# Patient Record
Sex: Female | Born: 1975
Health system: Southern US, Community
[De-identification: ages and names within clinical notes are randomized; demographics above are authoritative.]

## PROBLEM LIST (undated history)

## (undated) HISTORY — PX: TUBAL LIGATION: SHX77

---

## 2001-06-23 ENCOUNTER — Ambulatory Visit (HOSPITAL_COMMUNITY): Admission: RE | Admit: 2001-06-23 | Discharge: 2001-06-23 | Payer: Self-pay | Admitting: Internal Medicine

## 2011-05-30 ENCOUNTER — Encounter: Payer: Self-pay | Admitting: *Deleted

## 2011-05-30 ENCOUNTER — Emergency Department (HOSPITAL_COMMUNITY)
Admission: EM | Admit: 2011-05-30 | Discharge: 2011-05-30 | Disposition: A | Discharge status: Home / Self Care | Payer: Self-pay | Attending: Emergency Medicine | Admitting: Emergency Medicine

## 2011-05-30 ENCOUNTER — Emergency Department (HOSPITAL_COMMUNITY): Payer: Self-pay

## 2011-05-30 DIAGNOSIS — M25519 Pain in unspecified shoulder: Secondary | ICD-10-CM | POA: Insufficient documentation

## 2011-05-30 DIAGNOSIS — I10 Essential (primary) hypertension: Secondary | ICD-10-CM

## 2011-05-30 DIAGNOSIS — M7918 Myalgia, other site: Secondary | ICD-10-CM

## 2011-05-30 DIAGNOSIS — M542 Cervicalgia: Secondary | ICD-10-CM | POA: Insufficient documentation

## 2011-05-30 MED ORDER — KETOROLAC TROMETHAMINE 60 MG/2ML IM SOLN
60.0000 mg | Freq: Once | INTRAMUSCULAR | Status: AC
  Administered 2011-05-30: 60 mg via INTRAMUSCULAR
  Filled 2011-05-30: qty 2

## 2011-05-30 MED ORDER — DIAZEPAM 5 MG PO TABS
5.0000 mg | ORAL_TABLET | Freq: Once | ORAL | Status: AC
  Administered 2011-05-30: 5 mg via ORAL
  Filled 2011-05-30: qty 1

## 2011-05-30 MED ORDER — DIAZEPAM 5 MG PO TABS: 5.0000 mg | ORAL_TABLET | Freq: Three times a day (TID) | ORAL | Status: DC | PRN

## 2011-05-30 MED ORDER — DIAZEPAM 5 MG PO TABS: 5.0000 mg | ORAL_TABLET | Freq: Three times a day (TID) | ORAL | Status: AC | PRN

## 2011-05-30 NOTE — ED Provider Notes (Signed)
History     CSN: [id number] Arrival date & time: 05/30/2011  9:09 AM   Chief Complaint  Patient presents with  . Neck Pain     (Include location/radiation/quality/duration/timing/severity/associated sxs/prior treatment) Patient is a 35 y.o. female presenting with neck pain. The history is provided by the patient.  Neck Pain  Episode onset: She reports pain upon waking in her left neck and shoulder area, 3 weeks ago ,  which has not improved. The problem occurs constantly. The problem has not changed since onset.The pain is associated with nothing (Denies any known injury.). There has been no fever. The pain is present in the left side. The quality of the pain is described as stabbing. The pain radiates to the left shoulder. The pain is at a severity of 9/10. The pain is severe. The symptoms are aggravated by position and bending (palpation). The pain is the same all the time. Pertinent negatives include no chest pain, no numbness, no headaches, no paresis, no tingling and no weakness. She has tried NSAIDs for the symptoms. The treatment provided no relief.     History reviewed. No pertinent past medical history.   Past Surgical History  Procedure Date  . Cesarean section     History reviewed. No pertinent family history.  History  Substance Use Topics  . Smoking status: Current Everyday Smoker -- 0.5 packs/day  . Smokeless tobacco: Not on file  . Alcohol Use: No    OB History    Grav Para Term Preterm Abortions TAB SAB Ect Mult Living                  Review of Systems  Constitutional: Negative for fever.  HENT: Positive for neck pain. Negative for ear pain, congestion, sore throat and rhinorrhea.   Eyes: Negative.   Respiratory: Negative for cough, chest tightness and shortness of breath.   Cardiovascular: Negative for chest pain.  Gastrointestinal: Negative for nausea and abdominal pain.  Genitourinary: Negative.   Musculoskeletal: Negative for back pain, joint  swelling and arthralgias.  Skin: Negative.  Negative for rash and wound.  Neurological: Negative for dizziness, tingling, weakness, light-headedness, numbness and headaches.  Hematological: Negative.   Psychiatric/Behavioral: Negative.     Allergies  Penicillins and Sulfa antibiotics  Home Medications   Current Outpatient Rx  Name Route Sig Dispense Refill  . IBUPROFEN 200 MG PO TABS Oral Take 600 mg by mouth every 6 (six) hours as needed. For pain       Physical Exam    BP 152/106  Pulse 91  Temp(Src) 98.7 F (37.1 C) (Oral)  Resp 18  Ht 5\' 4"  (1.626 m)  Wt 180 lb (81.647 kg)  BMI 30.90 kg/m2  SpO2 100%  LMP 05/26/2011  Physical Exam  Nursing note and vitals reviewed. Constitutional: She is oriented to person, place, and time. She appears well-developed and well-nourished.  HENT:  Head: Normocephalic and atraumatic.  Eyes: Conjunctivae are normal.  Neck: Normal range of motion.  Cardiovascular: Normal rate, regular rhythm, normal heart sounds and intact distal pulses.   Pulmonary/Chest: Effort normal and breath sounds normal. She has no wheezes.  Abdominal: Soft. Bowel sounds are normal. There is no tenderness.  Musculoskeletal: She exhibits tenderness. She exhibits no edema.       Cervical back: She exhibits decreased range of motion, tenderness and bony tenderness. She exhibits no swelling, no edema and no spasm.  Neurological: She is alert and oriented to person, place, and time.  Skin: Skin  is warm and dry.  Psychiatric: She has a normal mood and affect.    ED Course  Procedures     MDM  Dg Cervical Spine Complete  05/30/2011  *RADIOLOGY REPORT*  Clinical Data: Neck pain.  CERVICAL SPINE - 4+ VIEWS  Comparison:  None.  Findings:  There is no evidence of cervical spine fracture or prevertebral soft tissue swelling.  Alignment is normal.  No other significant bone abnormalities are identified.  IMPRESSION: Negative cervical spine radiographs.  Original  Report Authenticated By: Reola Calkins, M.D.    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  DDX:  Muscle strain/myofascial pain  vs cervical strain.         Candis Musa, PA 05/30/11 1013

## 2011-05-30 NOTE — ED Notes (Signed)
Pt states she has pain in neck that she describes as a crick in her neck. Pt denies injury to neck.

## 2011-06-06 NOTE — ED Provider Notes (Signed)
Medical screening examination/treatment/procedure(s) were performed by non-physician practitioner and as supervising physician I was immediately available for consultation/collaboration.  Matvey Llanas, MD 06/06/11 0432 

## 2012-03-23 ENCOUNTER — Emergency Department (HOSPITAL_COMMUNITY)
Admission: EM | Admit: 2012-03-23 | Discharge: 2012-03-23 | Disposition: A | Discharge status: Home / Self Care | Payer: Self-pay | Attending: Emergency Medicine | Admitting: Emergency Medicine

## 2012-03-23 ENCOUNTER — Encounter (HOSPITAL_COMMUNITY): Payer: Self-pay | Admitting: *Deleted

## 2012-03-23 DIAGNOSIS — L723 Sebaceous cyst: Secondary | ICD-10-CM | POA: Insufficient documentation

## 2012-03-23 DIAGNOSIS — F17200 Nicotine dependence, unspecified, uncomplicated: Secondary | ICD-10-CM | POA: Insufficient documentation

## 2012-03-23 DIAGNOSIS — M25569 Pain in unspecified knee: Secondary | ICD-10-CM

## 2012-03-23 MED ORDER — ACETAMINOPHEN-CODEINE #3 300-30 MG PO TABS: 1.0000 | ORAL_TABLET | Freq: Four times a day (QID) | ORAL | Status: AC | PRN

## 2012-03-23 MED ORDER — MELOXICAM 7.5 MG PO TABS: ORAL_TABLET | ORAL | Status: DC

## 2012-03-23 NOTE — ED Notes (Signed)
Pt c/o pain in her right knee for approximately 6 months. Also c/o having a "knot behind her right knee that has been getting bigger over the last 4-5 months. States that it throbs at night and hurts all day in her knee cap. Pt able to move all extremities. Pt has swelling behind her right knee. No redness noted. Pt alert and oriented x 3. Skin warm and dry. Color pink.

## 2012-03-23 NOTE — ED Provider Notes (Signed)
History     CSN: [id number]  Arrival date & time 03/23/12  1523   First MD Initiated Contact with Patient 03/23/12 1554      Chief Complaint  Patient presents with  . Knee Pain    (Consider location/radiation/quality/duration/timing/severity/associated sxs/prior treatment) Patient is a 36 y.o. female presenting with knee pain. The history is provided by the patient.  Knee Pain This is a chronic problem. The current episode started more than 1 month ago. The problem has been gradually worsening. Associated symptoms include arthralgias. Pertinent negatives include no abdominal pain, chest pain, coughing or neck pain. The symptoms are aggravated by walking. She has tried NSAIDs for the symptoms. The treatment provided no relief.    History reviewed. No pertinent past medical history.  Past Surgical History  Procedure Date  . Cesarean section   . Tubal ligation     History reviewed. No pertinent family history.  History  Substance Use Topics  . Smoking status: Current Everyday Smoker -- 0.5 packs/day  . Smokeless tobacco: Not on file  . Alcohol Use: No    OB History    Grav Para Term Preterm Abortions TAB SAB Ect Mult Living                  Review of Systems  Constitutional: Negative for activity change.       All ROS Neg except as noted in HPI  HENT: Negative for nosebleeds and neck pain.   Eyes: Negative for photophobia and discharge.  Respiratory: Negative for cough, shortness of breath and wheezing.   Cardiovascular: Negative for chest pain and palpitations.  Gastrointestinal: Negative for abdominal pain and blood in stool.  Genitourinary: Negative for dysuria, frequency and hematuria.  Musculoskeletal: Positive for arthralgias. Negative for back pain.  Skin: Negative.   Neurological: Negative for dizziness, seizures and speech difficulty.  Psychiatric/Behavioral: Negative for hallucinations and confusion.    Allergies  Penicillins and Sulfa  antibiotics  Home Medications   Current Outpatient Rx  Name Route Sig Dispense Refill  . IBUPROFEN 200 MG PO TABS Oral Take 600 mg by mouth every 6 (six) hours as needed. For pain       BP 126/84  Pulse 78  Temp 98.2 F (36.8 C) (Oral)  Resp 18  Ht 5\' 4"  (1.626 m)  Wt 180 lb (81.647 kg)  BMI 30.90 kg/m2  SpO2 100%  LMP 03/09/2012  Physical Exam  Nursing note and vitals reviewed. Constitutional: She is oriented to person, place, and time. She appears well-developed and well-nourished.  Non-toxic appearance.  HENT:  Head: Normocephalic.  Right Ear: Tympanic membrane and external ear normal.  Left Ear: Tympanic membrane and external ear normal.  Eyes: EOM and lids are normal. Pupils are equal, round, and reactive to light.  Neck: Normal range of motion. Neck supple. Carotid bruit is not present.  Cardiovascular: Normal rate, regular rhythm, normal heart sounds, intact distal pulses and normal pulses.   Pulmonary/Chest: Breath sounds normal. No respiratory distress.  Abdominal: Soft. Bowel sounds are normal. There is no tenderness. There is no guarding.  Musculoskeletal: Normal range of motion.       Small cyst noted of the posterior right knee. There is crepitus with range of motion of the knee. There is no effusion. There is no hot joint. The distal pulses are symmetrical. Sensory is symmetrical. There is full range of motion of the right hip.  Lymphadenopathy:       Head (right side): No submandibular adenopathy  present.       Head (left side): No submandibular adenopathy present.    She has no cervical adenopathy.  Neurological: She is alert and oriented to person, place, and time. She has normal strength. No cranial nerve deficit or sensory deficit.  Skin: Skin is warm and dry.  Psychiatric: She has a normal mood and affect. Her speech is normal.    ED Course  Procedures (including critical care time)  Labs Reviewed - No data to display No results found.   No  diagnosis found.    MDM  I have reviewed nursing notes, vital signs, and all appropriate lab and imaging results for this patient. Patient reports 6 months of pain in the right knee. Recently she has noted some swelling of the cyst in the right knee. No acute or surgical findings at this time. Prescription for Mobic 7.5 mg twice daily given. Tylenol with codeine one every 4 hours as needed for pain also given. Patient is referred to orthopedics.       Kathie Dike, [place] 03/23/12 [phone]

## 2012-03-23 NOTE — ED Notes (Signed)
Swelling rt popliteal area for 6 mos, increasing pain

## 2012-03-25 NOTE — ED Provider Notes (Signed)
Medical screening examination/treatment/procedure(s) were performed by non-physician practitioner and as supervising physician I was immediately available for consultation/collaboration.   Laray Anger, DO 03/25/12 1525

## 2013-04-04 ENCOUNTER — Emergency Department (HOSPITAL_COMMUNITY)
Admission: EM | Admit: 2013-04-04 | Discharge: 2013-04-04 | Disposition: A | Discharge status: Home / Self Care | Payer: Self-pay | Attending: Emergency Medicine | Admitting: Emergency Medicine

## 2013-04-04 ENCOUNTER — Encounter (HOSPITAL_COMMUNITY): Payer: Self-pay | Admitting: Emergency Medicine

## 2013-04-04 DIAGNOSIS — R109 Unspecified abdominal pain: Secondary | ICD-10-CM | POA: Insufficient documentation

## 2013-04-04 DIAGNOSIS — Z3202 Encounter for pregnancy test, result negative: Secondary | ICD-10-CM | POA: Insufficient documentation

## 2013-04-04 DIAGNOSIS — R1013 Epigastric pain: Secondary | ICD-10-CM

## 2013-04-04 DIAGNOSIS — R110 Nausea: Secondary | ICD-10-CM | POA: Insufficient documentation

## 2013-04-04 DIAGNOSIS — F17200 Nicotine dependence, unspecified, uncomplicated: Secondary | ICD-10-CM | POA: Insufficient documentation

## 2013-04-04 DIAGNOSIS — Z880 Allergy status to penicillin: Secondary | ICD-10-CM | POA: Insufficient documentation

## 2013-04-04 LAB — LIPASE, BLOOD: Lipase: 25 U/L (ref 11–59)

## 2013-04-04 LAB — URINALYSIS, ROUTINE W REFLEX MICROSCOPIC
Bilirubin Urine: NEGATIVE
Glucose, UA: NEGATIVE mg/dL
Ketones, ur: NEGATIVE mg/dL
Leukocytes, UA: NEGATIVE
Protein, ur: NEGATIVE mg/dL
pH: 7 (ref 5.0–8.0)

## 2013-04-04 LAB — CBC WITH DIFFERENTIAL/PLATELET
Basophils Relative: 0 % (ref 0–1)
Eosinophils Absolute: 0.2 10*3/uL (ref 0.0–0.7)
Eosinophils Relative: 3 % (ref 0–5)
HCT: 44.2 % (ref 36.0–46.0)
Hemoglobin: 15.1 g/dL — ABNORMAL HIGH (ref 12.0–15.0)
Lymphs Abs: 2.5 10*3/uL (ref 0.7–4.0)
MCH: 34.1 pg — ABNORMAL HIGH (ref 26.0–34.0)
MCHC: 34.2 g/dL (ref 30.0–36.0)
MCV: 99.8 fL (ref 78.0–100.0)
Monocytes Absolute: 0.5 10*3/uL (ref 0.1–1.0)
Monocytes Relative: 5 % (ref 3–12)
Neutrophils Relative %: 65 % (ref 43–77)
RBC: 4.43 MIL/uL (ref 3.87–5.11)

## 2013-04-04 LAB — COMPREHENSIVE METABOLIC PANEL
Albumin: 3.9 g/dL (ref 3.5–5.2)
Alkaline Phosphatase: 58 U/L (ref 39–117)
BUN: 12 mg/dL (ref 6–23)
Creatinine, Ser: 0.71 mg/dL (ref 0.50–1.10)
GFR calc Af Amer: >90 mL/min (ref >90)
Glucose, Bld: 77 mg/dL (ref 70–99)
Potassium: 3.6 mEq/L (ref 3.5–5.1)
Total Protein: 7.5 g/dL (ref 6.0–8.3)

## 2013-04-04 MED ORDER — ONDANSETRON HCL 4 MG/2ML IJ SOLN
4.0000 mg | Freq: Once | INTRAMUSCULAR | Status: AC
  Administered 2013-04-04: 4 mg via INTRAVENOUS
  Filled 2013-04-04: qty 2

## 2013-04-04 MED ORDER — ONDANSETRON 4 MG PO TBDP: ORAL_TABLET | ORAL | Status: DC

## 2013-04-04 MED ORDER — KETOROLAC TROMETHAMINE 30 MG/ML IJ SOLN
30.0000 mg | Freq: Once | INTRAMUSCULAR | Status: AC
  Administered 2013-04-04: 30 mg via INTRAVENOUS
  Filled 2013-04-04: qty 1

## 2013-04-04 MED ORDER — OMEPRAZOLE 20 MG PO CPDR: 20.0000 mg | DELAYED_RELEASE_CAPSULE | Freq: Every day | ORAL | Status: DC

## 2013-04-04 MED ORDER — FAMOTIDINE IN NACL 20-0.9 MG/50ML-% IV SOLN
20.0000 mg | Freq: Once | INTRAVENOUS | Status: AC
  Administered 2013-04-04: 20 mg via INTRAVENOUS
  Filled 2013-04-04: qty 50

## 2013-04-04 MED ORDER — TRAMADOL HCL 50 MG PO TABS: 50.0000 mg | ORAL_TABLET | Freq: Four times a day (QID) | ORAL | Status: DC | PRN

## 2013-04-04 MED ORDER — SODIUM CHLORIDE 0.9 % IV BOLUS (SEPSIS)
1000.0000 mL | Freq: Once | INTRAVENOUS | Status: AC
  Administered 2013-04-04: 1000 mL via INTRAVENOUS

## 2013-04-04 NOTE — ED Provider Notes (Addendum)
CSN: [id number]     Arrival date & time 04/04/13  1607 History     First MD Initiated Contact with Patient 04/04/13 1613     Chief Complaint  Patient presents with  . Abdominal Pain  . Flank Pain  . Nausea   (Consider location/radiation/quality/duration/timing/severity/associated sxs/prior Treatment) HPI Comments: 37 yo female with C section hx, smoker, etoh 2 times daily presents with left flank pain since yesterday.  Pt had 3 days of epig pain and nausea and now pain mostly left flank.  Worse with palpation, intermittent sharp.  No kidney stone or gb hx.  No known ulcer or gerd.  Pt drinks etoh weekly.  No known GI hx.  No vomiting.  No bleeding.  Recently finished menstrual cycle.  Nothing improves.    Patient is a 37 y.o. female presenting with abdominal pain and flank pain. The history is provided by the patient.  Abdominal Pain This is a recurrent problem. Associated symptoms include abdominal pain. Pertinent negatives include no chest pain, no headaches and no shortness of breath.  Flank Pain Associated symptoms include abdominal pain. Pertinent negatives include no chest pain, no headaches and no shortness of breath.    History reviewed. No pertinent past medical history. Past Surgical History  Procedure Laterality Date  . Cesarean section    . Tubal ligation     No family history on file. History  Substance Use Topics  . Smoking status: Current Every Day Smoker -- 0.50 packs/day  . Smokeless tobacco: Not on file  . Alcohol Use: No   OB History   Grav Para Term Preterm Abortions TAB SAB Ect Mult Living                 Review of Systems  Constitutional: Negative for fever and chills.  HENT: Negative for neck pain and neck stiffness.   Eyes: Negative for visual disturbance.  Respiratory: Negative for shortness of breath.   Cardiovascular: Negative for chest pain.  Gastrointestinal: Positive for nausea and abdominal pain. Negative for vomiting.  Genitourinary:  Positive for flank pain. Negative for dysuria.  Musculoskeletal: Negative for back pain.  Skin: Negative for rash.  Neurological: Negative for light-headedness and headaches.    Allergies  Penicillins and Sulfa antibiotics  Home Medications   Current Outpatient Rx  Name  Route  Sig  Dispense  Refill  . ibuprofen (ADVIL,MOTRIN) 200 MG tablet   Oral   Take 600 mg by mouth every 6 (six) hours as needed. For pain           BP 146/88  Pulse 77  Temp(Src) 98.6 F (37 C) (Oral)  Resp 19  SpO2 99%  LMP 03/29/2013 Physical Exam  Nursing note and vitals reviewed. Constitutional: She is oriented to person, place, and time. She appears well-developed and well-nourished.  HENT:  Head: Normocephalic and atraumatic.  Eyes: Conjunctivae are normal. Right eye exhibits no discharge. Left eye exhibits no discharge.  Neck: Normal range of motion. Neck supple. No tracheal deviation present.  Cardiovascular: Normal rate and regular rhythm.   Pulmonary/Chest: Effort normal and breath sounds normal.  Abdominal: Soft. She exhibits no distension. There is tenderness (mild epig). There is no guarding.  Musculoskeletal: She exhibits tenderness (left flank lower ribs). She exhibits no edema.  Neurological: She is alert and oriented to person, place, and time.  Skin: Skin is warm. No rash noted.  Psychiatric: She has a normal mood and affect.    ED Course   Procedures (including  critical care time) Emergency Focused Ultrasound Exam: Limited abdomen of kidneys and bladder Indication: flank pain Focused abdominal ultrasound with kidneys imaged in transverse and longitudinal planes with bladder visualized in transverse plane. Interpretation: Both kidneys imaged in two planes, no hydronephrosis visualized.  No stones visualized  Images stored on the machine.   Labs Reviewed  URINALYSIS, ROUTINE W REFLEX MICROSCOPIC - Abnormal; Notable for the following:    Color, Urine STRAW (*)    All other  components within normal limits  PREGNANCY, URINE  CBC WITH DIFFERENTIAL  COMPREHENSIVE METABOLIC PANEL  LIPASE, BLOOD   No results found. No diagnosis found.  MDM  Concern for gastritis/ ulcer/ gerd with etoh hx vs kidney stone. No hydronephrosis on bedside US, no blood in urine. Well appearing, afebrile.  Improved on recheck. Stressed stopping etoh and close fup outpt. Smoking cessation discussed for 5 min with pt and outpt fup.  DC  Enid Skeens, MD 04/04/13 1830  Enid Skeens, MD 04/17/13 [phone]

## 2013-04-04 NOTE — ED Notes (Signed)
States she started having abdominal pain 3 days ago.  States that the pain began in her left flank yesterday.  States she has been nauseated, no vomiting.

## 2013-04-04 NOTE — ED Notes (Signed)
[  pt asking for prescription for pain , Dr. Jodi Mourning notified, additional prescription given

## 2013-05-01 ENCOUNTER — Encounter (HOSPITAL_COMMUNITY): Payer: Self-pay

## 2013-05-01 ENCOUNTER — Emergency Department (HOSPITAL_COMMUNITY)
Admission: EM | Admit: 2013-05-01 | Discharge: 2013-05-01 | Disposition: A | Discharge status: Home / Self Care | Payer: Self-pay | Attending: Emergency Medicine | Admitting: Emergency Medicine

## 2013-05-01 DIAGNOSIS — F17200 Nicotine dependence, unspecified, uncomplicated: Secondary | ICD-10-CM | POA: Insufficient documentation

## 2013-05-01 DIAGNOSIS — R609 Edema, unspecified: Secondary | ICD-10-CM | POA: Insufficient documentation

## 2013-05-01 DIAGNOSIS — Z880 Allergy status to penicillin: Secondary | ICD-10-CM | POA: Insufficient documentation

## 2013-05-01 LAB — CBC WITH DIFFERENTIAL/PLATELET
Basophils Absolute: 0 10*3/uL (ref 0.0–0.1)
Basophils Relative: 0 % (ref 0–1)
HCT: 39.5 % (ref 36.0–46.0)
Hemoglobin: 13.2 g/dL (ref 12.0–15.0)
Lymphocytes Relative: 25 % (ref 12–46)
MCHC: 33.4 g/dL (ref 30.0–36.0)
Monocytes Relative: 7 % (ref 3–12)
Neutro Abs: 5.3 10*3/uL (ref 1.7–7.7)
Neutrophils Relative %: 65 % (ref 43–77)
RDW: 12.7 % (ref 11.5–15.5)
WBC: 8.2 10*3/uL (ref 4.0–10.5)

## 2013-05-01 LAB — COMPREHENSIVE METABOLIC PANEL
AST: 16 U/L (ref 0–37)
Albumin: 3.4 g/dL — ABNORMAL LOW (ref 3.5–5.2)
Alkaline Phosphatase: 50 U/L (ref 39–117)
CO2: 30 mEq/L (ref 19–32)
Chloride: 101 mEq/L (ref 96–112)
GFR calc non Af Amer: >90 mL/min (ref >90)
Potassium: 4.2 mEq/L (ref 3.5–5.1)
Total Bilirubin: <0.1 mg/dL — ABNORMAL LOW (ref 0.3–1.2)

## 2013-05-01 LAB — URINALYSIS, ROUTINE W REFLEX MICROSCOPIC
Bilirubin Urine: NEGATIVE
Glucose, UA: NEGATIVE mg/dL
Ketones, ur: NEGATIVE mg/dL
Protein, ur: NEGATIVE mg/dL

## 2013-05-01 LAB — D-DIMER, QUANTITATIVE: D-Dimer, Quant: 0.45 ug/mL-FEU (ref 0.00–0.48)

## 2013-05-01 LAB — URINE MICROSCOPIC-ADD ON

## 2013-05-01 MED ORDER — TRAMADOL HCL 50 MG PO TABS: 50.0000 mg | ORAL_TABLET | Freq: Four times a day (QID) | ORAL | Status: DC | PRN

## 2013-05-01 MED ORDER — FUROSEMIDE 20 MG PO TABS: 20.0000 mg | ORAL_TABLET | Freq: Every day | ORAL | Status: DC

## 2013-05-01 MED ORDER — HYDROCODONE-ACETAMINOPHEN 5-325 MG PO TABS
1.0000 | ORAL_TABLET | Freq: Once | ORAL | Status: AC
  Administered 2013-05-01: 1 via ORAL
  Filled 2013-05-01: qty 1

## 2013-05-01 NOTE — Discharge Instructions (Signed)

## 2013-05-01 NOTE — ED Notes (Signed)
Pt reports swelling to both ankles for the past 4 days but left worse than right.  Denies any injury.

## 2013-05-01 NOTE — ED Provider Notes (Signed)
CSN: [id number]     Arrival date & time 05/01/13  1304 History   First MD Initiated Contact with Patient 05/01/13 1312     Chief Complaint  Patient presents with  . Foot Swelling    HPI Patient presents to emergency room with complaints of bilateral foot and ankle swelling. Left side is worse than the right. The symptoms started about 4 days ago. Patient states they are sore in the symptoms increase when she is standing. She has not had any recent falls or injuries. Patient has known history of kidney or liver disease. She has no history of DVT or PE. She denies any recent trips or travel. History reviewed. No pertinent past medical history. Past Surgical History  Procedure Laterality Date  . Cesarean section    . Tubal ligation     No family history on file. History  Substance Use Topics  . Smoking status: Current Every Day Smoker -- 0.50 packs/day  . Smokeless tobacco: Not on file  . Alcohol Use: No   OB History   Grav Para Term Preterm Abortions TAB SAB Ect Mult Living                 Review of Systems  All other systems reviewed and are negative.    Allergies  Penicillins and Sulfa antibiotics  Home Medications   Current Outpatient Rx  Name  Route  Sig  Dispense  Refill  . ibuprofen (ADVIL,MOTRIN) 200 MG tablet   Oral   Take 800 mg by mouth every 6 (six) hours as needed for pain. For pain         . furosemide (LASIX) 20 MG tablet   Oral   Take 1 tablet (20 mg total) by mouth daily.   5 tablet   0   . traMADol (ULTRAM) 50 MG tablet   Oral   Take 1 tablet (50 mg total) by mouth every 6 (six) hours as needed for pain.   15 tablet   0    BP 127/78  Pulse 78  Temp(Src) 98.4 F (36.9 C) (Oral)  Resp 18  Ht 5\' 4"  (1.626 m)  Wt 185 lb (83.915 kg)  BMI 31.74 kg/m2  SpO2 100%  LMP 04/29/2013 Physical Exam  Nursing note and vitals reviewed. Constitutional: She appears well-developed and well-nourished. No distress.  HENT:  Head: Normocephalic and  atraumatic.  Right Ear: External ear normal.  Left Ear: External ear normal.  Eyes: Conjunctivae are normal. Right eye exhibits no discharge. Left eye exhibits no discharge. No scleral icterus.  Neck: Neck supple. No tracheal deviation present.  Cardiovascular: Normal rate, regular rhythm and intact distal pulses.   Pulmonary/Chest: Effort normal and breath sounds normal. No stridor. No respiratory distress. She has no wheezes. She has no rales.  Abdominal: Soft. Bowel sounds are normal. She exhibits no distension. There is no tenderness. There is no rebound and no guarding.  Musculoskeletal: She exhibits edema. She exhibits no tenderness.  Pitting edema bilateral lower extremities up to the ankles, no calf tenderness bilaterally, normal pulses, normal perfusion, left is worse than the right  Neurological: She is alert. She has normal strength. No sensory deficit. Cranial nerve deficit:  no gross defecits noted. She exhibits normal muscle tone. She displays no seizure activity. Coordination normal.  Skin: Skin is warm and dry. No rash noted.  Psychiatric: She has a normal mood and affect.    ED Course   Procedures (including critical care time)  Labs Reviewed  CBC WITH DIFFERENTIAL - Abnormal; Notable for the following:    MCV 101.3 (*)    All other components within normal limits  COMPREHENSIVE METABOLIC PANEL - Abnormal; Notable for the following:    Albumin 3.4 (*)    Total Bilirubin <0.1 (*)    All other components within normal limits  URINALYSIS, ROUTINE W REFLEX MICROSCOPIC - Abnormal; Notable for the following:    APPearance HAZY (*)    Hgb urine dipstick LARGE (*)    All other components within normal limits  URINE MICROSCOPIC-ADD ON - Abnormal; Notable for the following:    Squamous Epithelial / LPF FEW (*)    Bacteria, UA FEW (*)    All other components within normal limits  D-DIMER, QUANTITATIVE   No results found. 1. Peripheral edema     MDM  Low suspicion for  DVT. Patient's symptoms are confined to her foot and ankle. We will plan on checking LFTs and electrolyte panel. If normal will start her on a low-dose diuretic with outpatient followup  Celene Kras, MD 05/01/13 [phone]

## 2013-05-01 NOTE — ED Notes (Signed)
Pedal pulses palpable bilaterally

## 2013-05-01 NOTE — ED Notes (Signed)
Pt presents via EMS secondary to increased swelling in bilateral feet, worsening over past 2 days. Pt denies past medical history medical diagnosis of CHF, cardiac,and hypertension, diabetes.  Pt denies N/V/D, fever and SOB at this time. Bilateral pulses present with brisk cap refill. Feet elevated without relief per pt. Pain increases to 8/10 with weight bearing. Pt is alert, oriented x 4, skin pink warm and dry to touch. NAD noted at this time.

## 2014-05-15 ENCOUNTER — Emergency Department (HOSPITAL_COMMUNITY)
Admission: EM | Admit: 2014-05-15 | Discharge: 2014-05-15 | Disposition: A | Discharge status: Home / Self Care | Payer: Medicaid Other | Attending: Emergency Medicine | Admitting: Emergency Medicine

## 2014-05-15 ENCOUNTER — Encounter (HOSPITAL_COMMUNITY): Payer: Self-pay | Admitting: Emergency Medicine

## 2014-05-15 DIAGNOSIS — F17200 Nicotine dependence, unspecified, uncomplicated: Secondary | ICD-10-CM | POA: Diagnosis not present

## 2014-05-15 DIAGNOSIS — Z880 Allergy status to penicillin: Secondary | ICD-10-CM | POA: Insufficient documentation

## 2014-05-15 DIAGNOSIS — Z791 Long term (current) use of non-steroidal anti-inflammatories (NSAID): Secondary | ICD-10-CM | POA: Insufficient documentation

## 2014-05-15 DIAGNOSIS — R1310 Dysphagia, unspecified: Secondary | ICD-10-CM | POA: Insufficient documentation

## 2014-05-15 LAB — CBC WITH DIFFERENTIAL/PLATELET
BASOS ABS: 0.1 10*3/uL (ref 0.0–0.1)
Basophils Relative: 1 % (ref 0–1)
EOS ABS: 0.3 10*3/uL (ref 0.0–0.7)
EOS PCT: 3 % (ref 0–5)
HEMATOCRIT: 41.6 % (ref 36.0–46.0)
Hemoglobin: 14.4 g/dL (ref 12.0–15.0)
Lymphocytes Relative: 29 % (ref 12–46)
Lymphs Abs: 2.6 10*3/uL (ref 0.7–4.0)
MCH: 33.7 pg (ref 26.0–34.0)
MCHC: 34.6 g/dL (ref 30.0–36.0)
MCV: 97.4 fL (ref 78.0–100.0)
MONO ABS: 0.4 10*3/uL (ref 0.1–1.0)
Monocytes Relative: 5 % (ref 3–12)
Neutro Abs: 5.6 10*3/uL (ref 1.7–7.7)
Neutrophils Relative %: 62 % (ref 43–77)
PLATELETS: 266 10*3/uL (ref 150–400)
RBC: 4.27 MIL/uL (ref 3.87–5.11)
RDW: 12.9 % (ref 11.5–15.5)
WBC: 8.9 10*3/uL (ref 4.0–10.5)

## 2014-05-15 LAB — BASIC METABOLIC PANEL
Anion gap: 10 (ref 5–15)
BUN: 12 mg/dL (ref 6–23)
CALCIUM: 9 mg/dL (ref 8.4–10.5)
CO2: 28 meq/L (ref 19–32)
CREATININE: 0.91 mg/dL (ref 0.50–1.10)
Chloride: 103 mEq/L (ref 96–112)
GFR calc Af Amer: >90 mL/min (ref >90)
GFR, EST NON AFRICAN AMERICAN: 78 mL/min — AB (ref >90)
GLUCOSE: 102 mg/dL — AB (ref 70–99)
Potassium: 4.3 mEq/L (ref 3.7–5.3)
SODIUM: 141 meq/L (ref 137–147)

## 2014-05-15 MED ORDER — HYDROCODONE-ACETAMINOPHEN 5-325 MG PO TABS
2.0000 | ORAL_TABLET | Freq: Once | ORAL | Status: AC
  Administered 2014-05-15: 2 via ORAL
  Filled 2014-05-15: qty 2

## 2014-05-15 NOTE — ED Provider Notes (Signed)
CSN: [id number]     Arrival date & time 05/15/14  1622 History   First MD Initiated Contact with Patient 05/15/14 1652     This chart was scribed for Hurman Horn, MD by Tonye Royalty, ED Scribe. This patient was seen in room [facility]15/[facility]15 and the patient's care was started at 4:54 PM.   No chief complaint on file. dysphagia  The history is provided by the patient. No language interpreter was used.   HPI Comments: Eileen Garza is a 38 y.o. female who presents to the Emergency Department complaining of chronic difficulty swallowing and throat pain for several months 24/7 with associated weight loss of 30 pounds. She reports that an incidental nodule seen on her thyroid in a CT scan of her head in June. She denies fever, nausea, vomiting, diarrhea, chest pain, suicidal ideation, or homicidal ideation.   History reviewed. No pertinent past medical history. Past Surgical History  Procedure Laterality Date  . Cesarean section    . Tubal ligation     History reviewed. No pertinent family history. History  Substance Use Topics  . Smoking status: Current Every Day Smoker -- 0.50 packs/day  . Smokeless tobacco: Not on file  . Alcohol Use: No   OB History   Grav Para Term Preterm Abortions TAB SAB Ect Mult Living                 Review of Systems 10 Systems reviewed and are negative for acute change except as noted in the HPI.   Allergies  Penicillins and Sulfa antibiotics  Home Medications   Prior to Admission medications   Medication Sig Start Date End Date Taking? Authorizing Provider  ibuprofen (ADVIL,MOTRIN) 200 MG tablet Take 800 mg by mouth every 6 (six) hours as needed for pain. For pain   Yes Historical Provider, MD  naproxen sodium (ALEVE) 220 MG tablet Take 440 mg by mouth 2 (two) times daily as needed (for pain).   Yes Historical Provider, MD   BP 148/100  Pulse 89  Temp(Src) 99.1 F (37.3 C) (Oral)  Resp 20  Ht  (1.626 m)  Wt 173 lb (78.472 kg)  BMI 29.68 kg/m2   SpO2 97%  LMP 05/10/2014 Physical Exam  Nursing note and vitals reviewed. Constitutional:  Awake, alert, nontoxic appearance.  HENT:  Head: Atraumatic.  Eyes: Right eye exhibits no discharge. Left eye exhibits no discharge.  Neck: Neck supple.  Non-tender, no masses, no stridor, no drooling, no swelling, good ROM, no lymphadenopathy, no palpable thyroid,   Cardiovascular: Normal rate, regular rhythm and normal heart sounds.   No murmur heard. Pulmonary/Chest: Effort normal and breath sounds normal. No respiratory distress. She has no wheezes. She has no rales. She exhibits no tenderness.  Abdominal: Soft. There is no tenderness. There is no rebound.  Musculoskeletal: She exhibits no tenderness.  Baseline ROM, no obvious new focal weakness.  Neurological:  Mental status and motor strength appears baseline for patient and situation.  Skin: No rash noted.  Psychiatric: She has a normal mood and affect.    ED Course  Procedures (including critical care time) Labs Review Labs Reviewed  BASIC METABOLIC PANEL - Abnormal; Notable for the following:    Glucose, Bld 102 (*)    GFR calc non Af Amer 78 (*)    All other components within normal limits  CBC WITH DIFFERENTIAL  TSH    Imaging Review No results found.   EKG Interpretation None  Patient / Family / Caregiver understand and agree with initial ED impression and plan with expectations set for ED visit.  MDM   Final diagnoses:  Dysphagia   I doubt any other EMC precluding discharge at this time including, but not necessarily limited to the following:airway obstruction, SBI.   I personally performed the services described in this documentation, which was scribed in my presence. The recorded information has been reviewed and is accurate.     Hurman Horn, MD 05/16/14 2222

## 2014-05-15 NOTE — ED Notes (Signed)
Pt appears anxious. Fidgeting with dress and necklace, wringing hands, speaking with a rapid speech and keeps referring to her neck pain and swelling as "the cancer on my thyroids".

## 2014-05-15 NOTE — Discharge Instructions (Signed)

## 2014-05-15 NOTE — ED Notes (Addendum)
Pt was seen at [facility] in June and told to f/u with her PCP for an enlarged thyroid seen on CT scan. Pt has appt with PCP in October for further evaluation but does not want to wait until that time to be seen. Pt reports her throat burning and having difficulty swallowing.

## 2014-05-15 NOTE — ED Notes (Signed)
Pt alert & oriented x4, stable gait. Patient given discharge instructions, paperwork & prescription(s). Patient  instructed to stop at the registration desk to finish any additional paperwork. Patient verbalized understanding. Pt left department w/ no further questions. 

## 2014-05-16 LAB — TSH: TSH: 3.19 u[IU]/mL (ref 0.350–4.500)

## 2014-05-23 ENCOUNTER — Telehealth (HOSPITAL_COMMUNITY): Payer: Self-pay

## 2014-11-22 ENCOUNTER — Emergency Department (HOSPITAL_COMMUNITY)
Admission: EM | Admit: 2014-11-22 | Discharge: 2014-11-22 | Disposition: A | Discharge status: Home / Self Care | Payer: Medicaid Other | Attending: Emergency Medicine | Admitting: Emergency Medicine

## 2014-11-22 ENCOUNTER — Emergency Department (HOSPITAL_COMMUNITY): Payer: Medicaid Other

## 2014-11-22 ENCOUNTER — Encounter (HOSPITAL_COMMUNITY): Payer: Self-pay | Admitting: Emergency Medicine

## 2014-11-22 DIAGNOSIS — Y9289 Other specified places as the place of occurrence of the external cause: Secondary | ICD-10-CM | POA: Diagnosis not present

## 2014-11-22 DIAGNOSIS — Y998 Other external cause status: Secondary | ICD-10-CM | POA: Insufficient documentation

## 2014-11-22 DIAGNOSIS — Z791 Long term (current) use of non-steroidal anti-inflammatories (NSAID): Secondary | ICD-10-CM | POA: Diagnosis not present

## 2014-11-22 DIAGNOSIS — S63642A Sprain of metacarpophalangeal joint of left thumb, initial encounter: Secondary | ICD-10-CM

## 2014-11-22 DIAGNOSIS — Z880 Allergy status to penicillin: Secondary | ICD-10-CM | POA: Insufficient documentation

## 2014-11-22 DIAGNOSIS — S63602A Unspecified sprain of left thumb, initial encounter: Secondary | ICD-10-CM | POA: Insufficient documentation

## 2014-11-22 DIAGNOSIS — X58XXXA Exposure to other specified factors, initial encounter: Secondary | ICD-10-CM | POA: Insufficient documentation

## 2014-11-22 DIAGNOSIS — Z720 Tobacco use: Secondary | ICD-10-CM | POA: Diagnosis not present

## 2014-11-22 DIAGNOSIS — S5332XA Traumatic rupture of left ulnar collateral ligament, initial encounter: Secondary | ICD-10-CM

## 2014-11-22 DIAGNOSIS — Y93E5 Activity, floor mopping and cleaning: Secondary | ICD-10-CM | POA: Diagnosis not present

## 2014-11-22 DIAGNOSIS — S6992XA Unspecified injury of left wrist, hand and finger(s), initial encounter: Secondary | ICD-10-CM | POA: Diagnosis present

## 2014-11-22 MED ORDER — HYDROCODONE-ACETAMINOPHEN 5-325 MG PO TABS: ORAL_TABLET | ORAL | Status: AC

## 2014-11-22 NOTE — ED Notes (Signed)
Dc instructions reviewed explained and reviewed. 1 Rx given to pt.

## 2014-11-22 NOTE — Discharge Instructions (Signed)
Gamekeeper's, Skier's Thumb °You have injured some ligaments in your thumb. This can happen suddenly or over a long period of time. It may be difficult for you to hold things by pinching them between your thumb and finger. The injury may take 6 to 8 weeks to heal. Your injury is a strain injury and not a complete tear so it may be treated with a cast. A complete tear of the ligament can lead to a loss of function of the thumb if not fixed surgically. It got its name from when gamekeepers used to kill game (hunted or trapped animals) by pinning them down around the neck between their thumb and index finger.  °HOME CARE INSTRUCTIONS  °· Apply ice to the injury for 15-20 minutes, 03-04 times per day for the first 2 days. Put the ice in a plastic bag and place a towel between the bag of ice and your skin. °· Avoid using your thumb for as long as directed by your caregiver if it is not splinted or in a cast. °· If your hand has been casted or splinted while your thumb heals, follow these instructions: °· Plaster or fiberglass cast: °¨ Do not try to scratch the skin under the cast using a sharp or pointed object. °¨ Check the skin around the cast every day. You may put lotion on any red or sore areas. °¨ Keep your cast dry. Your cast can be protected during bathing with a plastic bag. Do not put your cast into the water. °¨ If your fiberglass cast gets wet, it can be gently dried using a hair dryer. Be careful not burn yourself. °· Plaster splint: °¨ Wear the splint for as long as directed by your caregiver or until you are seen for a follow-up examination. °¨ Do not get your splint wet. Protect it during bathing with a plastic bag. °¨ You may loosen the elastic bandage around the splint if your fingers start to get numb, tingle, get cold, or turn blue. °· Do not put pressure on your cast or splint; this may cause it to break. Do not lean on hard surfaces for 24 hours after application. °· Only take over-the-counter or  prescription medicines for pain, discomfort, or fever as directed by your caregiver. °· IMPORTANT: follow up with your caregiver or keep or call for any appointments with specialists as directed. The failure to follow up could result in chronic pain and / or disability. °SEEK IMMEDIATE MEDICAL CARE IF:  °Your thumb or fingers change color, become painful, or there is numbness or tingling in your thumb or fingers. Your cast or splint may be too tight. °MAKE SURE YOU:  °· Understand these instructions. °· Will watch your condition. °· Will get help right away if you are not doing well or get worse. °Document Released: 08/26/2005 Document Revised: 11/18/2011 Document Reviewed: 04/14/2008 °[employer]® Patient Information ©2015 [employer]. This information is not intended to replace advice given to you by your health care provider. Make sure you discuss any questions you have with your health care provider. ° °

## 2014-11-22 NOTE — ED Notes (Signed)
Patient with c/o left thumb and hand pain that started yesterday. Reports sweeping a lot and then hearing a pop. Blisters noted.

## 2014-11-24 NOTE — ED Provider Notes (Signed)
CSN: [id number]     Arrival date & time 11/22/14  1010 History   First MD Initiated Contact with Patient 11/22/14 1101     Chief Complaint  Patient presents with  . Hand Pain     (Consider location/radiation/quality/duration/timing/severity/associated sxs/prior Treatment) HPI   Eileen Garza is a 39 y.o. female who presents to the Emergency Department complaining of left thumb pain for one day.  She states that she was sweeping with a broom and felt a "pop" in her thumb.  She now reports pain and difficulty extending her thumb.  She denies swelling, redness, wrist pain , numbness or weakness of the digit.  She has not tried any therapies.   History reviewed. No pertinent past medical history. Past Surgical History  Procedure Laterality Date  . Cesarean section    . Tubal ligation     No family history on file. History  Substance Use Topics  . Smoking status: Current Every Day Smoker -- 0.50 packs/day  . Smokeless tobacco: Not on file  . Alcohol Use: No   OB History    No data available     Review of Systems  Constitutional: Negative for fever and chills.  Genitourinary: Negative for dysuria and difficulty urinating.  Musculoskeletal: Positive for arthralgias (left thumb pain). Negative for joint swelling.  Skin: Negative for color change and wound.  All other systems reviewed and are negative.     Allergies  Penicillins and Sulfa antibiotics  Home Medications   Prior to Admission medications   Medication Sig Start Date End Date Taking? Authorizing Provider  ibuprofen (ADVIL,MOTRIN) 200 MG tablet Take 800 mg by mouth every 6 (six) hours as needed for pain. For pain   Yes Historical Provider, MD  naproxen sodium (ANAPROX) 220 MG tablet Take 440 mg by mouth 2 (two) times daily as needed (pain).   Yes Historical Provider, MD  HYDROcodone-acetaminophen (NORCO/VICODIN) 5-325 MG per tablet Take one-two tabs po q 4-6 hrs prn pain 11/22/14   Tammi Addisen Chappelle, PA-C   BP  123/78 mmHg  Pulse 96  Temp(Src) 98.5 F (36.9 C) (Oral)  Resp 16  Ht  (1.626 m)  Wt 145 lb (65.772 kg)  BMI 24.88 kg/m2  SpO2 100%  LMP 10/30/2014 Physical Exam  Constitutional: She is oriented to person, place, and time. She appears well-developed and well-nourished. No distress.  HENT:  Head: Normocephalic and atraumatic.  Cardiovascular: Normal rate, regular rhythm and normal heart sounds.   Pulmonary/Chest: Effort normal and breath sounds normal.  Musculoskeletal: She exhibits tenderness. She exhibits no edema.  Tenderness and laxity of the proximal left thumb and tenderness of the thenar eminence.   Radial pulse is brisk, distal sensation intact.  CR< 2 sec.  No bruising or bony deformity.    Neurological: She is alert and oriented to person, place, and time. She exhibits normal muscle tone. Coordination normal.  Skin: Skin is warm and dry.  Nursing note and vitals reviewed.   ED Course  Procedures (including critical care time) Labs Review Labs Reviewed - No data to display  Imaging Review Dg Hand Complete Left  11/22/2014   CLINICAL DATA:  Persistent pain following carpal tunnel surgery approximately 1 month prior  EXAM: LEFT HAND - COMPLETE 3+ VIEW  COMPARISON:  None.  FINDINGS: Frontal, oblique, and lateral views were obtained. There is evidence of screw and plate fixation in the distal radius region. There is no acute fracture or dislocation. Joint spaces appear intact. No erosive change  or intra-articular calcification.  IMPRESSION: Postoperative change distal radius. No acute fracture or dislocation. No appreciable arthropathic change or intra-articular calcification.   Electronically Signed   By: Bretta BangWilliam  Woodruff III M.D.   On: 11/22/2014 11:45      EKG Interpretation None      MDM   Final diagnoses:  Gamekeeper's thumb of left hand, initial encounter    Patient with likely gamekeeper's thumb injury after sweeping.  Thumb spica splint applied.  Pain  improved.  Remains NV intact.  Referral to ortho.  Pt also agrees to elevate and apply ice.      Severiano Gilbertammi Ghadeer Kastelic, PA-C 11/24/14 2212  Gerhard Munchobert Lockwood, MD 11/26/14 [phone]

## 2014-11-26 ENCOUNTER — Emergency Department (HOSPITAL_COMMUNITY)
Admission: EM | Admit: 2014-11-26 | Discharge: 2014-11-26 | Disposition: A | Discharge status: Home / Self Care | Payer: Medicaid Other | Attending: Emergency Medicine | Admitting: Emergency Medicine

## 2014-11-26 ENCOUNTER — Encounter (HOSPITAL_COMMUNITY): Payer: Self-pay

## 2014-11-26 DIAGNOSIS — M25561 Pain in right knee: Secondary | ICD-10-CM | POA: Diagnosis present

## 2014-11-26 DIAGNOSIS — Z720 Tobacco use: Secondary | ICD-10-CM | POA: Diagnosis not present

## 2014-11-26 DIAGNOSIS — Z880 Allergy status to penicillin: Secondary | ICD-10-CM | POA: Diagnosis not present

## 2014-11-26 DIAGNOSIS — Z791 Long term (current) use of non-steroidal anti-inflammatories (NSAID): Secondary | ICD-10-CM | POA: Insufficient documentation

## 2014-11-26 NOTE — Discharge Instructions (Signed)
Wear Ace bandage for the next several days.  Ice for 20 minutes every 2 hours while awake for the next 2 days.  Ibuprofen 600 mg every 6 hours as needed for pain.  Follow-up with your primary Dr. if not improving in the next week.  Knee Pain The knee is the complex joint between your thigh and your lower leg. It is made up of bones, tendons, ligaments, and cartilage. The bones that make up the knee are:  The femur in the thigh.  The tibia and fibula in the lower leg.  The patella or kneecap riding in the groove on the lower femur. CAUSES  Knee pain is a common complaint with many causes. A few of these causes are:  Injury, such as:  A ruptured ligament or tendon injury.  Torn cartilage.  Medical conditions, such as:  Gout  Arthritis  Infections  Overuse, over training, or overdoing a physical activity. Knee pain can be minor or severe. Knee pain can accompany debilitating injury. Minor knee problems often respond well to self-care measures or get well on their own. More serious injuries may need medical intervention or even surgery. SYMPTOMS The knee is complex. Symptoms of knee problems can vary widely. Some of the problems are:  Pain with movement and weight bearing.  Swelling and tenderness.  Buckling of the knee.  Inability to straighten or extend your knee.  Your knee locks and you cannot straighten it.  Warmth and redness with pain and fever.  Deformity or dislocation of the kneecap. DIAGNOSIS  Determining what is wrong may be very straight forward such as when there is an injury. It can also be challenging because of the complexity of the knee. Tests to make a diagnosis may include:  Your caregiver taking a history and doing a physical exam.  Routine X-rays can be used to rule out other problems. X-rays will not reveal a cartilage tear. Some injuries of the knee can be diagnosed by:  Arthroscopy a surgical technique by which a small video camera is  inserted through tiny incisions on the sides of the knee. This procedure is used to examine and repair internal knee joint problems. Tiny instruments can be used during arthroscopy to repair the torn knee cartilage (meniscus).  Arthrography is a radiology technique. A contrast liquid is directly injected into the knee joint. Internal structures of the knee joint then become visible on X-ray film.  An MRI scan is a non X-ray radiology procedure in which magnetic fields and a computer produce two- or three-dimensional images of the inside of the knee. Cartilage tears are often visible using an MRI scanner. MRI scans have largely replaced arthrography in diagnosing cartilage tears of the knee.  Blood work.  Examination of the fluid that helps to lubricate the knee joint (synovial fluid). This is done by taking a sample out using a needle and a syringe. TREATMENT The treatment of knee problems depends on the cause. Some of these treatments are:  Depending on the injury, proper casting, splinting, surgery, or physical therapy care will be needed.  Give yourself adequate recovery time. Do not overuse your joints. If you begin to get sore during workout routines, back off. Slow down or do fewer repetitions.  For repetitive activities such as cycling or running, maintain your strength and nutrition.  Alternate muscle groups. For example, if you are a weight lifter, work the upper body on one day and the lower body the next.  Either tight or weak muscles  do not give the proper support for your knee. Tight or weak muscles do not absorb the stress placed on the knee joint. Keep the muscles surrounding the knee strong.  Take care of mechanical problems.  If you have flat feet, orthotics or special shoes may help. See your caregiver if you need help.  Arch supports, sometimes with wedges on the inner or outer aspect of the heel, can help. These can shift pressure away from the side of the knee most  bothered by osteoarthritis.  A brace called an "unloader" brace also may be used to help ease the pressure on the most arthritic side of the knee.  If your caregiver has prescribed crutches, braces, wraps or ice, use as directed. The acronym for this is PRICE. This means protection, rest, ice, compression, and elevation.  Nonsteroidal anti-inflammatory drugs (NSAIDs), can help relieve pain. But if taken immediately after an injury, they may actually increase swelling. Take NSAIDs with food in your stomach. Stop them if you develop stomach problems. Do not take these if you have a history of ulcers, stomach pain, or bleeding from the bowel. Do not take without your caregiver's approval if you have problems with fluid retention, heart failure, or kidney problems.  For ongoing knee problems, physical therapy may be helpful.  Glucosamine and chondroitin are over-the-counter dietary supplements. Both may help relieve the pain of osteoarthritis in the knee. These medicines are different from the usual anti-inflammatory drugs. Glucosamine may decrease the rate of cartilage destruction.  Injections of a corticosteroid drug into your knee joint may help reduce the symptoms of an arthritis flare-up. They may provide pain relief that lasts a few months. You may have to wait a few months between injections. The injections do have a small increased risk of infection, water retention, and elevated blood sugar levels.  Hyaluronic acid injected into damaged joints may ease pain and provide lubrication. These injections may work by reducing inflammation. A series of shots may give relief for as long as 6 months.  Topical painkillers. Applying certain ointments to your skin may help relieve the pain and stiffness of osteoarthritis. Ask your pharmacist for suggestions. Many over the-counter products are approved for temporary relief of arthritis pain.  In some countries, doctors often prescribe topical NSAIDs for  relief of chronic conditions such as arthritis and tendinitis. A review of treatment with NSAID creams found that they worked as well as oral medications but without the serious side effects. PREVENTION  Maintain a healthy weight. Extra pounds put more strain on your joints.  Get strong, stay limber. Weak muscles are a common cause of knee injuries. Stretching is important. Include flexibility exercises in your workouts.  Be smart about exercise. If you have osteoarthritis, chronic knee pain or recurring injuries, you may need to change the way you exercise. This does not mean you have to stop being active. If your knees ache after jogging or playing basketball, consider switching to swimming, water aerobics, or other low-impact activities, at least for a few days a week. Sometimes limiting high-impact activities will provide relief.  Make sure your shoes fit well. Choose footwear that is right for your sport.  Protect your knees. Use the proper gear for knee-sensitive activities. Use kneepads when playing volleyball or laying carpet. Buckle your seat belt every time you drive. Most shattered kneecaps occur in car accidents.  Rest when you are tired. SEEK MEDICAL CARE IF:  You have knee pain that is continual and does not seem  to be getting better.  SEEK IMMEDIATE MEDICAL CARE IF:  Your knee joint feels hot to the touch and you have a high fever. MAKE SURE YOU:   Understand these instructions.  Will watch your condition.  Will get help right away if you are not doing well or get worse. Document Released: 06/23/2007 Document Revised: 11/18/2011 Document Reviewed: 06/23/2007 [facility] Patient Information 2015 [place], [place]. This information is not intended to replace advice given to you by your health care provider. Make sure you discuss any questions you have with your health care provider.

## 2014-11-26 NOTE — ED Notes (Signed)
Pt c/o pain and swelling to right knee x several days, denies recent injury

## 2014-11-26 NOTE — ED Provider Notes (Signed)
CSN: [id number]     Arrival date & time 11/26/14  4098 History   First MD Initiated Contact with Patient 11/26/14 0545     Chief Complaint  Patient presents with  . Knee Pain     (Consider location/radiation/quality/duration/timing/severity/associated sxs/prior Treatment) HPI Comments: Patient is a 39 year old female who presents for evaluation of right knee pain. She states that she was walking in her home and bumped the inside of her knee on a table. Since that time she is having discomfort with ambulation and has a small bruise to the inside of the knee. She denies any prior knee injury with the exception of "ligament damage" when she was 39 years old.  Patient is a 39 y.o. female presenting with knee pain. The history is provided by the patient.  Knee Pain Location:  Knee Time since incident:  2 days Injury: yes   Knee location:  R knee Pain details:    Quality:  Sharp   Radiates to:  Does not radiate   Severity:  Moderate   Onset quality:  Sudden   Duration:  2 days   Timing:  Constant   Progression:  Worsening Chronicity:  New Relieved by:  Nothing Worsened by:  Nothing tried   History reviewed. No pertinent past medical history. Past Surgical History  Procedure Laterality Date  . Cesarean section    . Tubal ligation     No family history on file. History  Substance Use Topics  . Smoking status: Current Every Day Smoker -- 0.50 packs/day  . Smokeless tobacco: Not on file  . Alcohol Use: No   OB History    No data available     Review of Systems  All other systems reviewed and are negative.     Allergies  Penicillins and Sulfa antibiotics  Home Medications   Prior to Admission medications   Medication Sig Start Date End Date Taking? Authorizing Provider  naproxen sodium (ANAPROX) 220 MG tablet Take 440 mg by mouth 2 (two) times daily as needed (pain).   Yes Historical Provider, MD  HYDROcodone-acetaminophen (NORCO/VICODIN) 5-325 MG per tablet Take  one-two tabs po q 4-6 hrs prn pain 11/22/14   Tammi Triplett, PA-C  ibuprofen (ADVIL,MOTRIN) 200 MG tablet Take 800 mg by mouth every 6 (six) hours as needed for pain. For pain    Historical Provider, MD   BP 147/90 mmHg  Pulse 89  Temp(Src) 98.6 F (37 C) (Oral)  Resp 16  Ht  (1.626 m)  Wt 155 lb (70.308 kg)  BMI 26.59 kg/m2  SpO2 100%  LMP 10/30/2014 Physical Exam  Constitutional: She is oriented to person, place, and time. She appears well-developed and well-nourished. No distress.  HENT:  Head: Normocephalic and atraumatic.  Neck: Normal range of motion. Neck supple.  Musculoskeletal: Normal range of motion.  The right knee appears grossly normal without effusion, warmth, or erythema. There is only a very small, less than 1 cm round bruise to the medial aspect of the knee. There is full range of motion without crepitus. The anterior and posterior drawer tests are negative. There is no laxity with varus or valgus stress. Distal PMS is intact.  Neurological: She is alert and oriented to person, place, and time.  Skin: Skin is warm and dry. She is not diaphoretic.  Nursing note and vitals reviewed.   ED Course  Procedures (including critical care time) Labs Review Labs Reviewed - No data to display  Imaging Review No results found.  EKG Interpretation None      MDM   Final diagnoses:  None    Patient presents with right knee pain. Her physical examination is unremarkable and the knee appears very stable. I see no indication for imaging or further workup at this time. She will be placed in a compressive dressing, advised to take ibuprofen, and follow-up with her primary Dr. if not improving in 1 week. She was observed to ambulate into and out of the department with no limp.    Geoffery Lyonsouglas Raevin Wierenga, MD 11/26/14 [phone]

## 2015-07-14 IMAGING — DX DG HAND COMPLETE 3+V*L*
3 series · 3 of 3 positions shown · non-contrast
Comparison: None.

CLINICAL DATA: Persistent pain following carpal tunnel surgery
approximately 1 month prior

EXAM:
LEFT HAND - COMPLETE 3+ VIEW

[hand pa]
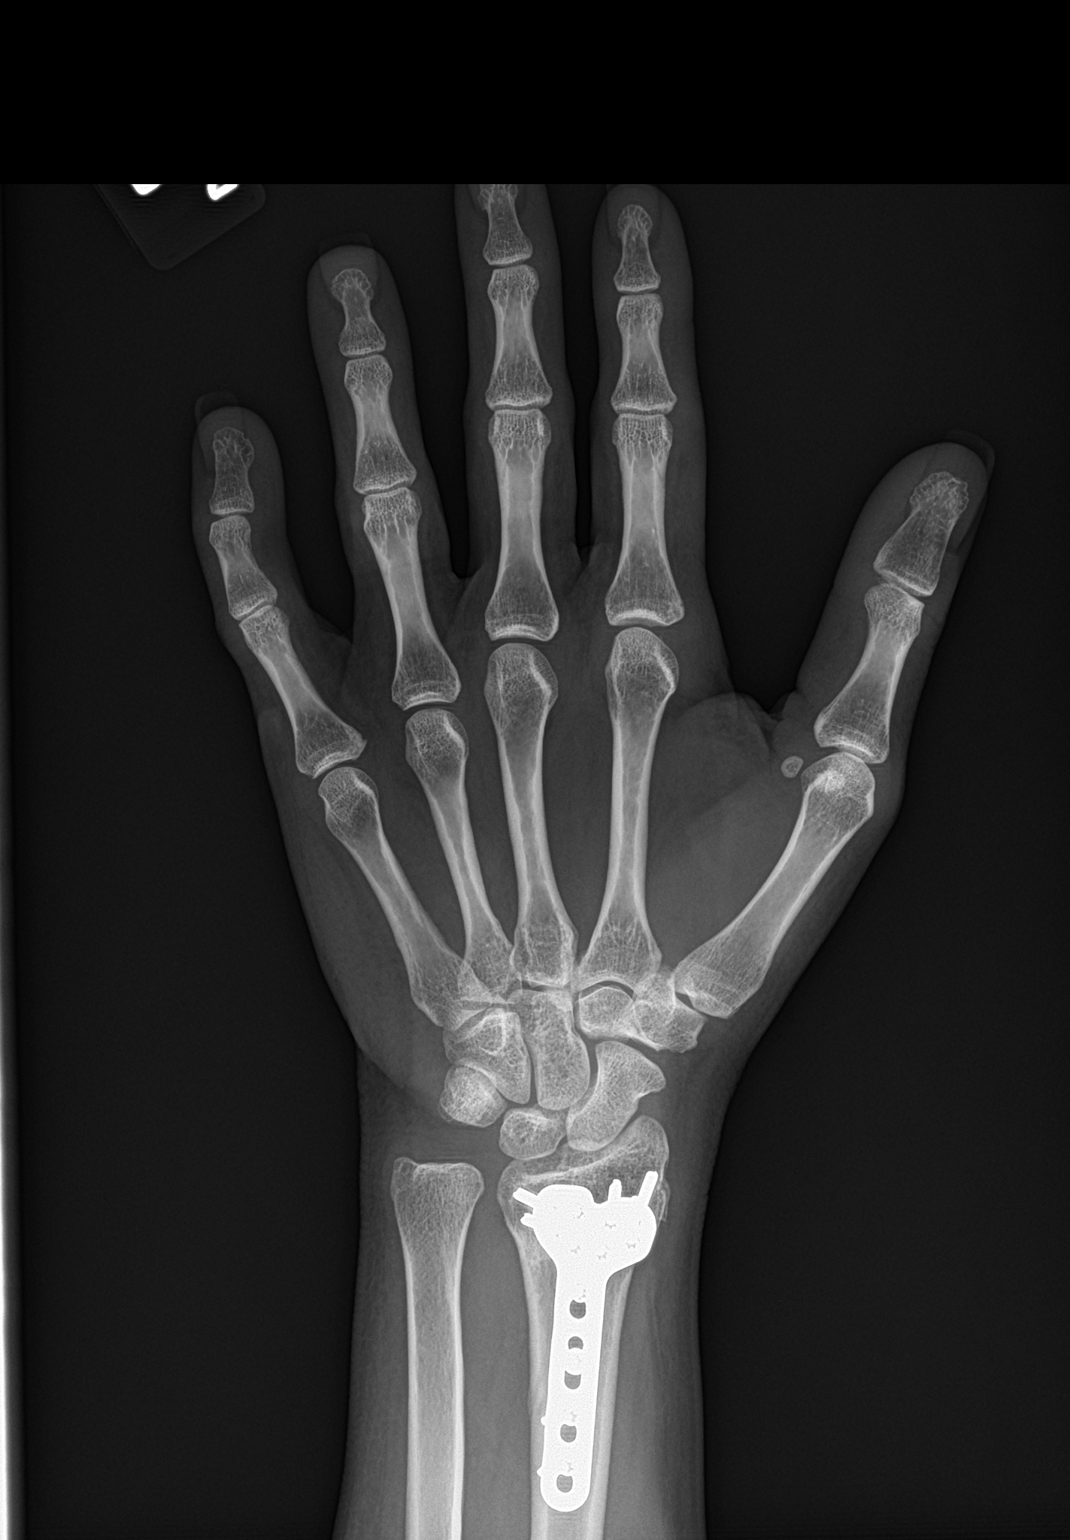

[hand obl]
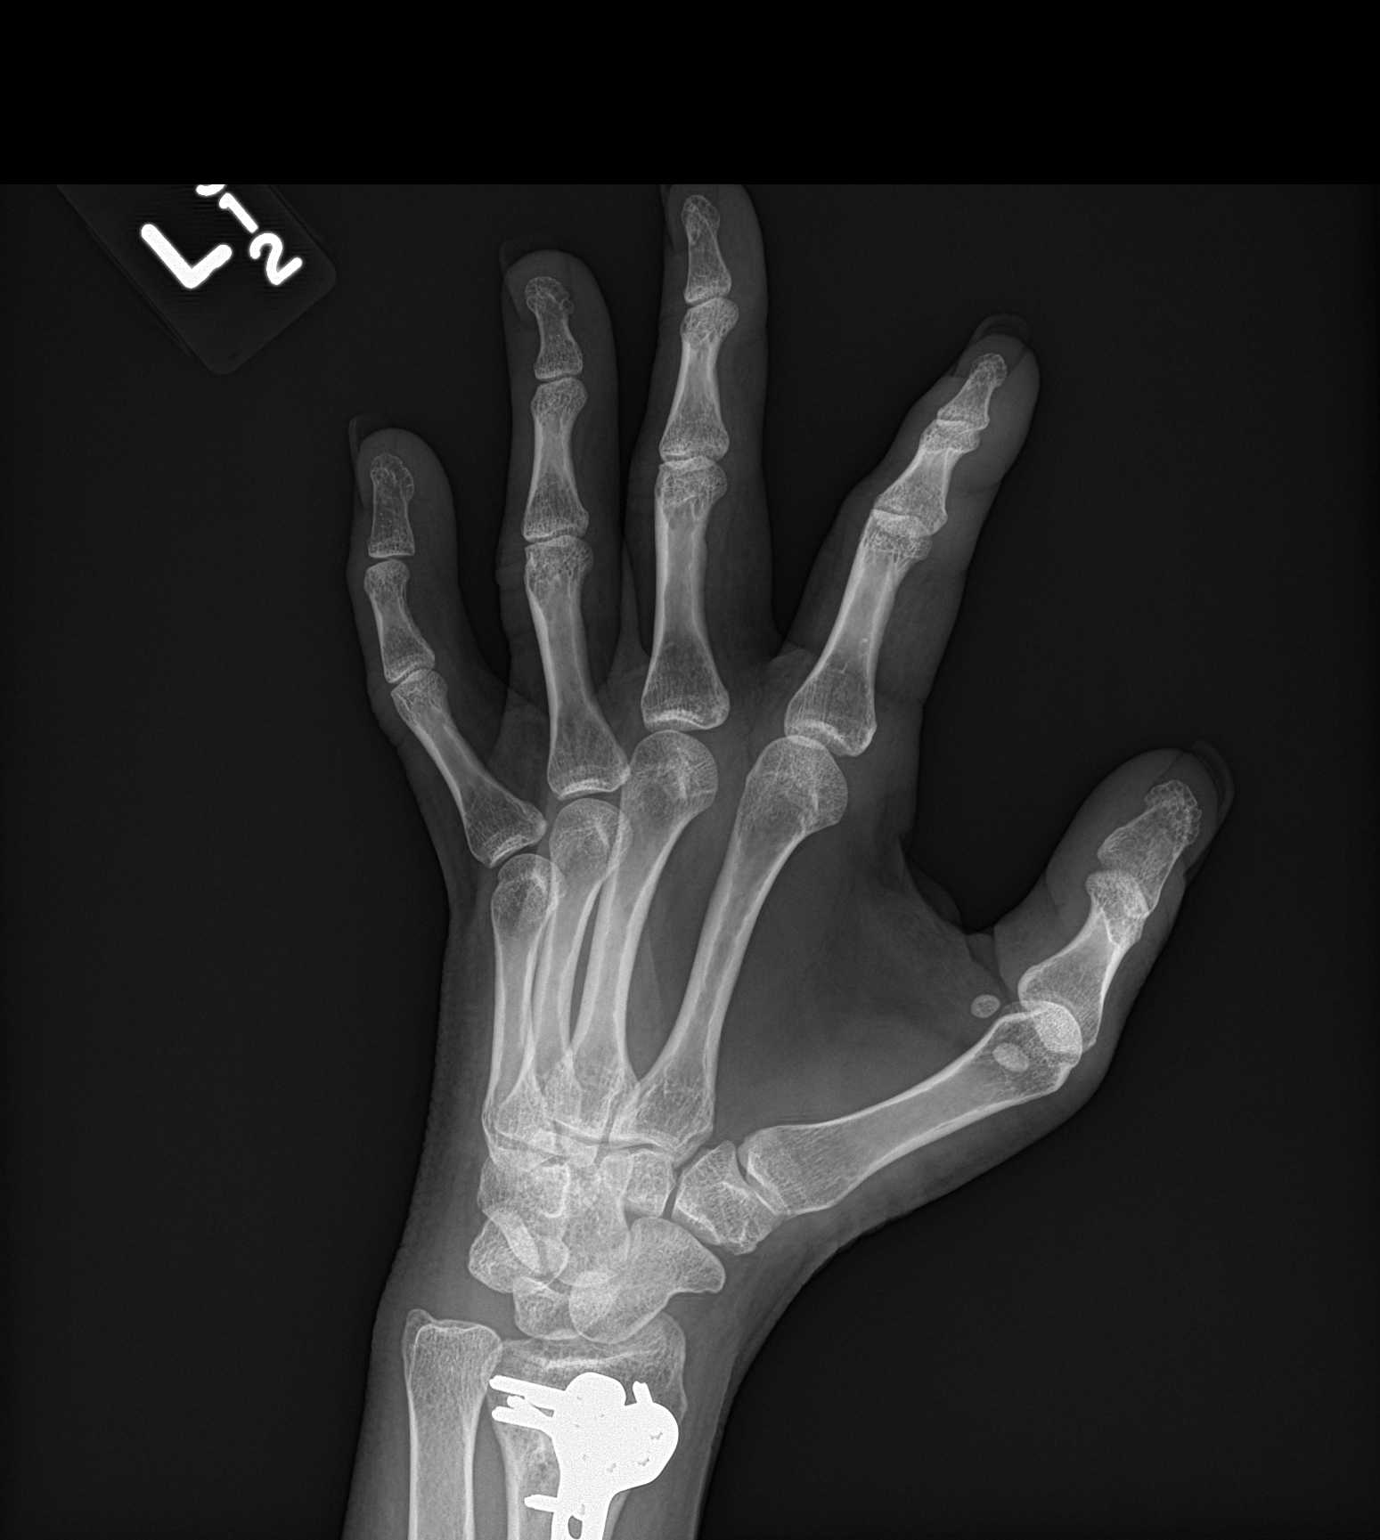

[hand lat]
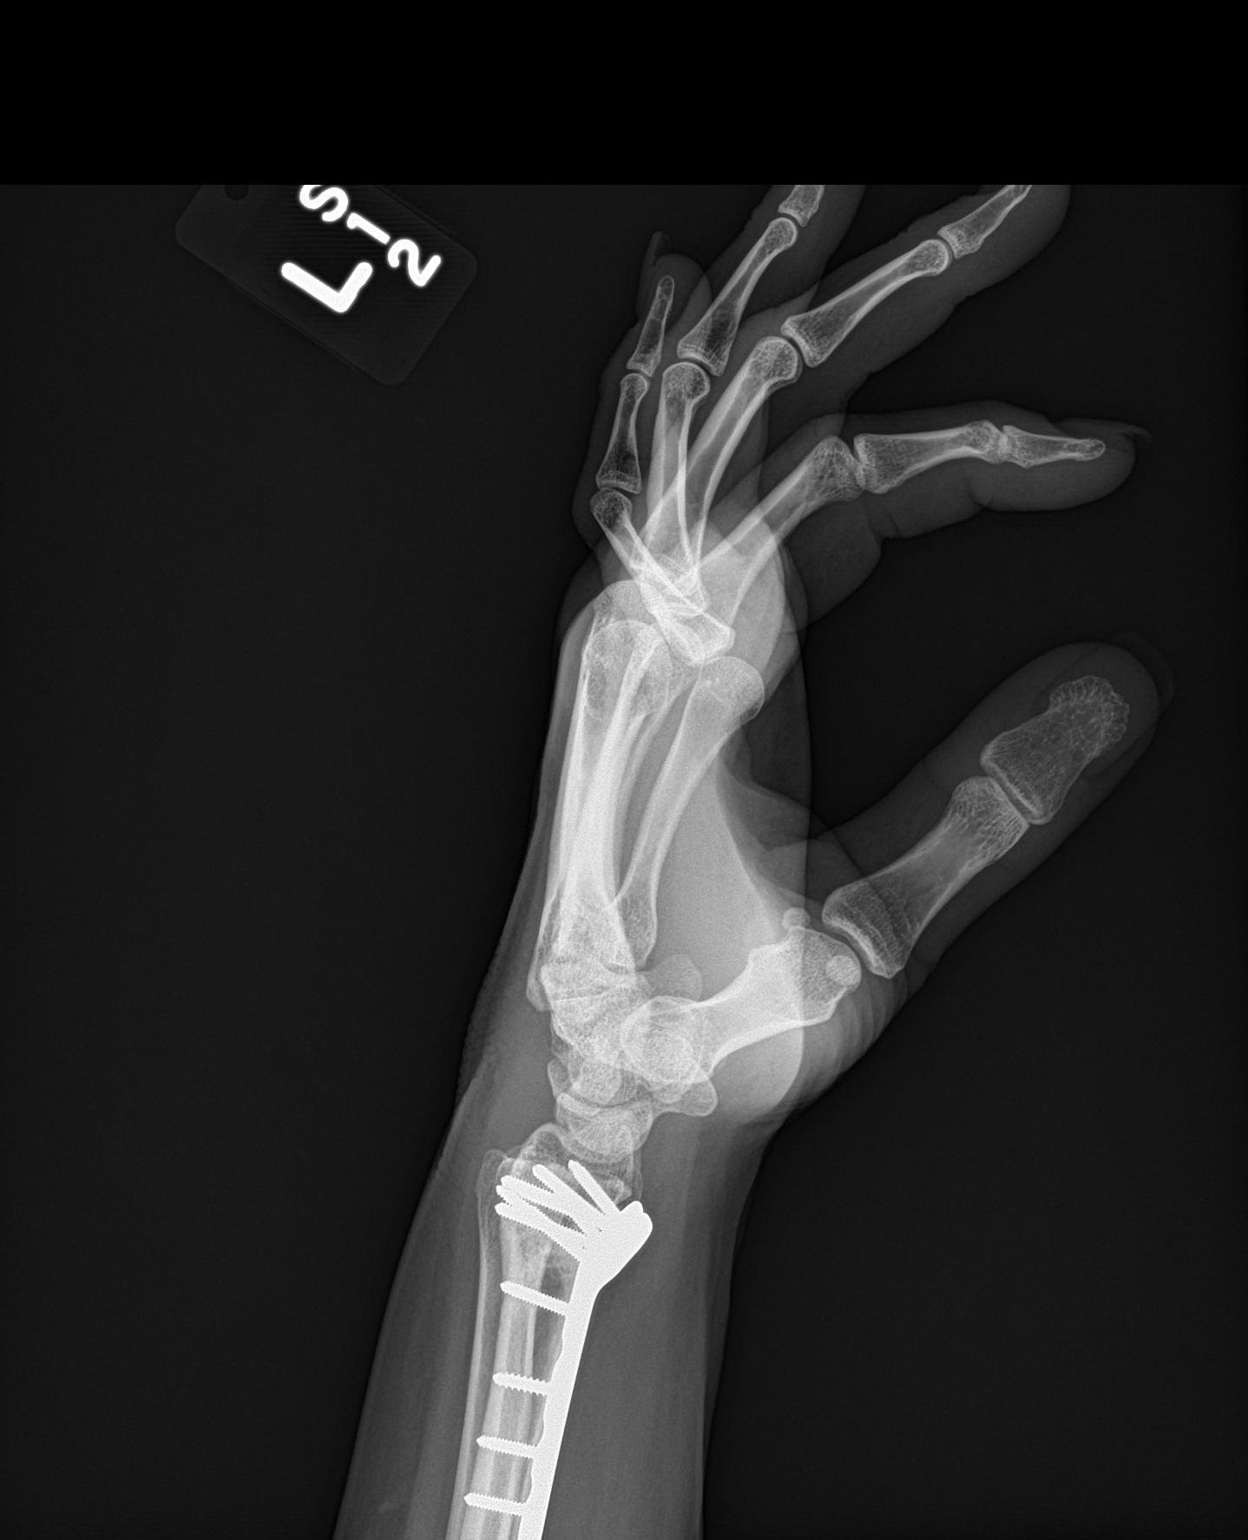

[3 of 3 positions shown; findings below may reference images not displayed]

FINDINGS: Frontal, oblique, and lateral views were obtained. There is evidence
of screw and plate fixation in the distal radius region. There is no
acute fracture or dislocation. Joint spaces appear intact. No
erosive change or intra-articular calcification.
IMPRESSION: Postoperative change distal radius. No acute fracture or
dislocation. No appreciable arthropathic change or intra-articular
calcification.

## 2023-07-03 ENCOUNTER — Encounter (HOSPITAL_COMMUNITY): Payer: Self-pay

## 2023-07-03 ENCOUNTER — Other Ambulatory Visit: Payer: Self-pay

## 2023-07-03 ENCOUNTER — Emergency Department (HOSPITAL_COMMUNITY)
Admission: EM | Admit: 2023-07-03 | Discharge: 2023-07-03 | Discharge status: Against Medical Advice | Payer: Medicaid Other | Attending: Emergency Medicine | Admitting: Emergency Medicine

## 2023-07-03 DIAGNOSIS — R220 Localized swelling, mass and lump, head: Secondary | ICD-10-CM | POA: Insufficient documentation

## 2023-07-03 DIAGNOSIS — Z5321 Procedure and treatment not carried out due to patient leaving prior to being seen by health care provider: Secondary | ICD-10-CM | POA: Insufficient documentation

## 2023-07-03 DIAGNOSIS — R6884 Jaw pain: Secondary | ICD-10-CM | POA: Diagnosis not present

## 2023-07-03 NOTE — ED Triage Notes (Signed)
RIGHT sided facial swelling, RIGHT lower jaw  19 teeth pulled out 5 months ago Dentures since 3 abscesses after wearing dentures  [facility] last FRIDAY recd ABX, pt stated she has 4 pills left and face is feeling worse   "It feels like my lip is going to bust"

## 2024-02-17 ENCOUNTER — Other Ambulatory Visit (HOSPITAL_COMMUNITY): Payer: Self-pay | Admitting: Family Medicine

## 2024-02-17 DIAGNOSIS — Z1231 Encounter for screening mammogram for malignant neoplasm of breast: Secondary | ICD-10-CM

## 2024-03-01 ENCOUNTER — Ambulatory Visit (HOSPITAL_COMMUNITY)
Admission: RE | Admit: 2024-03-01 | Discharge: 2024-03-01 | Disposition: A | Discharge status: Home / Self Care | Source: Ambulatory Visit | Attending: Family Medicine | Admitting: Family Medicine

## 2024-03-01 DIAGNOSIS — Z1231 Encounter for screening mammogram for malignant neoplasm of breast: Secondary | ICD-10-CM | POA: Diagnosis present

## 2024-03-04 ENCOUNTER — Encounter (INDEPENDENT_AMBULATORY_CARE_PROVIDER_SITE_OTHER): Payer: Self-pay | Admitting: *Deleted

## 2024-03-08 ENCOUNTER — Other Ambulatory Visit (HOSPITAL_COMMUNITY): Payer: Self-pay | Admitting: Family Medicine

## 2024-03-08 DIAGNOSIS — R928 Other abnormal and inconclusive findings on diagnostic imaging of breast: Secondary | ICD-10-CM

## 2024-03-30 ENCOUNTER — Ambulatory Visit (HOSPITAL_COMMUNITY)

## 2024-03-30 ENCOUNTER — Encounter (HOSPITAL_COMMUNITY)

## 2024-04-06 ENCOUNTER — Encounter (HOSPITAL_COMMUNITY): Payer: Self-pay

## 2024-04-06 ENCOUNTER — Inpatient Hospital Stay (HOSPITAL_COMMUNITY): Admission: RE | Admit: 2024-04-06 | Source: Ambulatory Visit

## 2024-04-06 ENCOUNTER — Ambulatory Visit (HOSPITAL_COMMUNITY): Admission: RE | Admit: 2024-04-06 | Source: Ambulatory Visit

## 2024-09-06 ENCOUNTER — Encounter (INDEPENDENT_AMBULATORY_CARE_PROVIDER_SITE_OTHER): Payer: Self-pay | Admitting: *Deleted
